# Patient Record
Sex: Female | Born: 1970 | Marital: Married | Smoking: Never smoker
Health system: Southern US, Community
[De-identification: ages and names within clinical notes are randomized; demographics above are authoritative.]

---
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
## 2006-10-26 DIAGNOSIS — R200 Anesthesia of skin: Secondary | ICD-10-CM | POA: Insufficient documentation

## 2006-10-26 DIAGNOSIS — G4700 Insomnia, unspecified: Secondary | ICD-10-CM | POA: Insufficient documentation

## 2010-01-10 HISTORY — PX: CHOLECYSTECTOMY: SHX55

## 2014-01-10 HISTORY — PX: TOTAL MASTECTOMY: SHX6129

## 2014-12-04 DIAGNOSIS — D0511 Intraductal carcinoma in situ of right breast: Secondary | ICD-10-CM | POA: Insufficient documentation

## 2014-12-22 DIAGNOSIS — Z853 Personal history of malignant neoplasm of breast: Secondary | ICD-10-CM | POA: Insufficient documentation

## 2015-01-11 HISTORY — PX: NEPHRECTOMY: SHX65

## 2015-12-09 DIAGNOSIS — E28319 Asymptomatic premature menopause: Secondary | ICD-10-CM | POA: Insufficient documentation

## 2015-12-11 DIAGNOSIS — E039 Hypothyroidism, unspecified: Secondary | ICD-10-CM | POA: Insufficient documentation

## 2016-01-18 DIAGNOSIS — E039 Hypothyroidism, unspecified: Secondary | ICD-10-CM | POA: Diagnosis not present

## 2016-01-20 DIAGNOSIS — R194 Change in bowel habit: Secondary | ICD-10-CM | POA: Diagnosis not present

## 2016-01-20 DIAGNOSIS — E039 Hypothyroidism, unspecified: Secondary | ICD-10-CM | POA: Diagnosis not present

## 2016-01-20 DIAGNOSIS — H04123 Dry eye syndrome of bilateral lacrimal glands: Secondary | ICD-10-CM | POA: Diagnosis not present

## 2016-02-10 DIAGNOSIS — E28319 Asymptomatic premature menopause: Secondary | ICD-10-CM | POA: Diagnosis not present

## 2016-02-10 DIAGNOSIS — K439 Ventral hernia without obstruction or gangrene: Secondary | ICD-10-CM | POA: Insufficient documentation

## 2016-02-25 DIAGNOSIS — K439 Ventral hernia without obstruction or gangrene: Secondary | ICD-10-CM | POA: Diagnosis not present

## 2016-03-02 DIAGNOSIS — N952 Postmenopausal atrophic vaginitis: Secondary | ICD-10-CM | POA: Diagnosis not present

## 2016-03-02 DIAGNOSIS — K439 Ventral hernia without obstruction or gangrene: Secondary | ICD-10-CM | POA: Diagnosis not present

## 2016-03-31 ENCOUNTER — Other Ambulatory Visit: Payer: Self-pay | Admitting: Family Medicine

## 2016-03-31 ENCOUNTER — Ambulatory Visit
Admission: RE | Admit: 2016-03-31 | Discharge: 2016-03-31 | Disposition: A | Discharge status: Home / Self Care | Payer: 59 | Source: Ambulatory Visit | Attending: Family Medicine | Admitting: Family Medicine

## 2016-03-31 DIAGNOSIS — T148XXA Other injury of unspecified body region, initial encounter: Secondary | ICD-10-CM

## 2016-03-31 DIAGNOSIS — R0781 Pleurodynia: Secondary | ICD-10-CM | POA: Diagnosis not present

## 2016-03-31 DIAGNOSIS — R079 Chest pain, unspecified: Secondary | ICD-10-CM | POA: Diagnosis not present

## 2016-03-31 DIAGNOSIS — M25512 Pain in left shoulder: Secondary | ICD-10-CM | POA: Diagnosis not present

## 2016-03-31 DIAGNOSIS — M542 Cervicalgia: Secondary | ICD-10-CM | POA: Diagnosis not present

## 2016-05-26 DIAGNOSIS — Z0000 Encounter for general adult medical examination without abnormal findings: Secondary | ICD-10-CM | POA: Diagnosis not present

## 2016-05-31 DIAGNOSIS — Z0000 Encounter for general adult medical examination without abnormal findings: Secondary | ICD-10-CM | POA: Diagnosis not present

## 2016-05-31 DIAGNOSIS — Z23 Encounter for immunization: Secondary | ICD-10-CM | POA: Diagnosis not present

## 2016-09-14 DIAGNOSIS — Z23 Encounter for immunization: Secondary | ICD-10-CM | POA: Diagnosis not present

## 2016-09-22 DIAGNOSIS — Z23 Encounter for immunization: Secondary | ICD-10-CM | POA: Diagnosis not present

## 2016-09-22 DIAGNOSIS — H1031 Unspecified acute conjunctivitis, right eye: Secondary | ICD-10-CM | POA: Diagnosis not present

## 2016-11-07 DIAGNOSIS — E039 Hypothyroidism, unspecified: Secondary | ICD-10-CM | POA: Diagnosis not present

## 2016-11-07 DIAGNOSIS — K130 Diseases of lips: Secondary | ICD-10-CM | POA: Diagnosis not present

## 2016-11-08 DIAGNOSIS — E039 Hypothyroidism, unspecified: Secondary | ICD-10-CM | POA: Diagnosis not present

## 2016-11-15 DIAGNOSIS — Z170 Estrogen receptor positive status [ER+]: Secondary | ICD-10-CM | POA: Diagnosis not present

## 2016-11-15 DIAGNOSIS — C50812 Malignant neoplasm of overlapping sites of left female breast: Secondary | ICD-10-CM | POA: Diagnosis not present

## 2016-11-15 DIAGNOSIS — D0511 Intraductal carcinoma in situ of right breast: Secondary | ICD-10-CM | POA: Diagnosis not present

## 2016-11-23 DIAGNOSIS — Z853 Personal history of malignant neoplasm of breast: Secondary | ICD-10-CM | POA: Diagnosis not present

## 2016-11-23 DIAGNOSIS — H1031 Unspecified acute conjunctivitis, right eye: Secondary | ICD-10-CM | POA: Diagnosis not present

## 2016-12-26 DIAGNOSIS — H04123 Dry eye syndrome of bilateral lacrimal glands: Secondary | ICD-10-CM | POA: Diagnosis not present

## 2016-12-26 DIAGNOSIS — E039 Hypothyroidism, unspecified: Secondary | ICD-10-CM | POA: Diagnosis not present

## 2017-01-06 DIAGNOSIS — E079 Disorder of thyroid, unspecified: Secondary | ICD-10-CM | POA: Diagnosis not present

## 2017-01-23 DIAGNOSIS — H10413 Chronic giant papillary conjunctivitis, bilateral: Secondary | ICD-10-CM | POA: Diagnosis not present

## 2017-01-23 DIAGNOSIS — H01111 Allergic dermatitis of right upper eyelid: Secondary | ICD-10-CM | POA: Diagnosis not present

## 2017-01-23 DIAGNOSIS — H01114 Allergic dermatitis of left upper eyelid: Secondary | ICD-10-CM | POA: Diagnosis not present

## 2017-02-06 DIAGNOSIS — J301 Allergic rhinitis due to pollen: Secondary | ICD-10-CM | POA: Diagnosis not present

## 2017-02-06 DIAGNOSIS — J3089 Other allergic rhinitis: Secondary | ICD-10-CM | POA: Diagnosis not present

## 2017-02-06 DIAGNOSIS — R05 Cough: Secondary | ICD-10-CM | POA: Diagnosis not present

## 2017-02-17 DIAGNOSIS — R5383 Other fatigue: Secondary | ICD-10-CM | POA: Diagnosis not present

## 2017-02-20 DIAGNOSIS — L309 Dermatitis, unspecified: Secondary | ICD-10-CM | POA: Diagnosis not present

## 2017-02-20 DIAGNOSIS — E039 Hypothyroidism, unspecified: Secondary | ICD-10-CM | POA: Diagnosis not present

## 2017-03-01 ENCOUNTER — Encounter (HOSPITAL_COMMUNITY): Payer: Self-pay | Admitting: Psychiatry

## 2017-03-01 ENCOUNTER — Ambulatory Visit (INDEPENDENT_AMBULATORY_CARE_PROVIDER_SITE_OTHER): Payer: 59 | Admitting: Psychiatry

## 2017-03-01 DIAGNOSIS — Z79899 Other long term (current) drug therapy: Secondary | ICD-10-CM

## 2017-03-01 DIAGNOSIS — R4581 Low self-esteem: Secondary | ICD-10-CM

## 2017-03-01 DIAGNOSIS — Z818 Family history of other mental and behavioral disorders: Secondary | ICD-10-CM | POA: Diagnosis not present

## 2017-03-01 DIAGNOSIS — F411 Generalized anxiety disorder: Secondary | ICD-10-CM

## 2017-03-01 DIAGNOSIS — F3341 Major depressive disorder, recurrent, in partial remission: Secondary | ICD-10-CM

## 2017-03-01 MED ORDER — SERTRALINE HCL 100 MG PO TABS: 100.0000 mg | ORAL_TABLET | Freq: Every day | ORAL | 1 refills | Status: DC

## 2017-03-01 MED ORDER — LORAZEPAM 1 MG PO TABS: 1.0000 mg | ORAL_TABLET | Freq: Every day | ORAL | 0 refills | Status: DC | PRN

## 2017-03-01 NOTE — Progress Notes (Signed)
Psychiatric Initial Adult Assessment   Patient Identification: Tonya Rojas MRN:  [id number] Date of Evaluation:  03/01/2017 Referral Source: self Chief Complaint:  depression Visit Diagnosis:    ICD-10-CM   1. Recurrent major depressive disorder, in partial remission (HCC) F33.41 sertraline (ZOLOFT) 100 MG tablet    Ambulatory referral to Psychology  2. GAD (generalized anxiety disorder) F41.1     History of Present Illness:  Tonya Rojas is a 47 year old female with a psychiatric history of depression and generalized anxiety, family history of depression.  She presents today for psychiatric intake assessment.  She recently transitioned here from [place] approximately 1 year ago with her husband and 3 children.  She has been contending with this transition in addition to issues related to her physical health including thyroid disease and recent double mastectomy and overectomy due to estrogen receptor positive cancer.  This has caused an increase in anxiety and mood symptoms as she had to contend with issues of mortality, although she is happy to share she is physically healthy now.  Spent time with her learning about her upbringing, and a particularly high achieving and goal oriented household, feeling that she had to be perfect in a way.  She developed obsessive tendencies and tendencies of anxiety and rumination that she grew older in order to cope with multiple stressful transitions.  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  She was medicated on Effexor for many years and continues on 75 mg XR daily.  Over the past year, she has noticed her depression and anxiety have been much more difficult to manage, she has difficulty with poor self-esteem, difficulty with sleeping due to rumination, difficulty with anhedonia and low motivation to participate in activities.  She identifies as a generally introverted individual.  She has a brother and sister that  are both on psychiatric medications, and she was able to find out today that they both take Zoloft and have had a good response.  I spent time with her educating her on the risks and benefits of SSRI, Zoloft, and agreed to discontinue Effexor and low-dose Lexapro 5 mg.  Her primary care doctor had initiated her on Lexapro in conjunction with Effexor.  She does not have any acute safety issues or substance use.  She is receptive to a referral for individual therapy.  Associated Signs/Symptoms: Depression Symptoms:  depressed mood, anhedonia, insomnia, psychomotor retardation, fatigue, feelings of worthlessness/guilt, impaired memory, anxiety, (Hypo) Manic Symptoms:  Irritable Mood, Anxiety Symptoms:  Excessive Worry, Psychotic Symptoms:  none PTSD Symptoms: Negative  Past Psychiatric History: No inpatient psychiatric hospitalization, outpatient therapy and medication management  Previous Psychotropic Medications: Yes   Substance Abuse History in the last 12 months:  No.  Consequences of Substance Abuse: Negative  Past Medical History: History reviewed. No pertinent past medical history. History reviewed. No pertinent surgical history.  Family Psychiatric History: Reviewed, family history of depression and anxiety  Family History: History reviewed. No pertinent family history.  Social History:   Social History   Socioeconomic History  . Marital status: Married    Spouse name: None  . Number of children: None  . Years of education: None  . Highest education level: None  Social Needs  . Financial resource strain: None  . Food insecurity - worry: None  . Food insecurity - inability: None  . Transportation needs - medical: None  . Transportation needs - non-medical: None  Occupational History  . None  Tobacco Use  . Smoking status:  None  Substance and Sexual Activity  . Alcohol use: None  . Drug use: None  . Sexual activity: None  Other Topics Concern  . None   Social History Narrative  . 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 albuterol (PROVENTIL HFA;VENTOLIN HFA) 108 (90 Base) MCG/ACT inhaler Inhale into the lungs.    Marland Kitchen aspirin 81 MG chewable tablet Chew by mouth.    Marland Kitchen azelastine (OPTIVAR) 0.05 % ophthalmic solution   0  . levothyroxine (SYNTHROID, LEVOTHROID) 112 MCG tablet take 1 tablet by mouth every other day IN THE MORNING ON AN EMPTY STOMACH  0  . levothyroxine (SYNTHROID, LEVOTHROID) 125 MCG tablet take 1 tablet by mouth every other day ON AN EMPTY STOMACH  0  . LORazepam (ATIVAN) 1 MG tablet Take 1 tablet (1 mg total) by mouth daily as needed for anxiety. 30 tablet 0  . sertraline (ZOLOFT) 100 MG tablet Take 1 tablet (100 mg total) by mouth daily. Take 1/2 tablet for 1 week then increase to 1 tablet daily 90 tablet 1   No current facility-administered medications for this visit.     Neurologic: Headache: Negative Seizure: Negative Paresthesias:Negative  Musculoskeletal: Strength & Muscle Tone: within normal limits Gait & Station: normal Patient leans: N/A  Psychiatric Specialty Exam: Review of Systems  Constitutional: Negative.   HENT: Negative.   Respiratory: Negative.   Cardiovascular: Negative.   Gastrointestinal: Negative.   Genitourinary: Negative.   Musculoskeletal: Negative.   Neurological: Negative.   Psychiatric/Behavioral: Positive for depression and memory loss. The patient is nervous/anxious.     There were no vitals taken for this visit.There is no height or weight on file to calculate BMI.  General Appearance: Casual and Well Groomed  Eye Contact:  Good   Speech:  Clear and Coherent and Normal Rate  Volume:  Normal  Mood:  Dysphoric  Affect:  Congruent  Thought Process:  Goal Directed and Descriptions of Associations: Intact  Orientation:  Full (Time, Place, and Person)  Thought Content:  Logical  Suicidal Thoughts:  No  Homicidal Thoughts:  No  Memory:  Immediate;   Good  Judgement:  Good  Insight:  Good  Psychomotor Activity:  Normal  Concentration:  Concentration: Good  Recall:  Good  Fund of Knowledge:Good  Language: Good  Akathisia:  Negative  Handed:  Right  AIMS (if indicated):  na  Assets:  Communication Skills Desire for Improvement Financial Resources/Insurance Housing Intimacy Leisure Time Physical Health Resilience Social Support Talents/Skills Transportation Vocational/Educational  ADL's:  Intact  Cognition: WNL  Sleep:  6 hours, poor    Treatment Plan Summary: Tonya Rojas is a 47 year old female with a history of breast cancer, status post double mastectomy and oophorectomy.  She has a psychiatric history of major depressive disorder with anxious features.  She presents today with only partial remission of her symptoms on Effexor and Lexapro.  She has tried increased doses of Effexor without adequate response.  She has a positive family history of response to Zoloft for depression, and presents today with some obsessive tendencies.  I suspect she would benefit from a more serotonergic agent such as Zoloft, and  have instructed her to proceed as below.  No acute safety issues or suicidality or substance use.  She may use Ativan on an as-needed basis 2-3 times per week for acute anxiety.  1. Recurrent major depressive disorder, in partial remission ([place])   2. GAD (generalized anxiety disorder)     Status of current problems: new  Labs Ordered: Orders Placed This Encounter  Procedures  . Ambulatory referral to Psychology    Referral Priority:   Routine    Referral Type:   Psychiatric    Referral  Reason:   Specialty Services Required    Requested Specialty:   Psychology    Number of Visits Requested:   1    Labs Reviewed: n/a  Collateral Obtained/Records Reviewed: n/a  Plan:  Discontinue Effexor 75 mg xr Discontinue Lexapro 5 mg Zoloft 50 mg x 1 week then increase to 100 mg Okay to increase Zoloft in 3-4 weeks, to 150 mg  I spent 45 minutes with the patient in direct face-to-face clinical care.  Greater than 50% of this time was spent in counseling and coordination of care with the patient.    Aundra Dubin, MD 2/20/201910:39 AM

## 2017-03-29 ENCOUNTER — Ambulatory Visit: Payer: 59 | Admitting: Clinical

## 2017-03-30 ENCOUNTER — Encounter (HOSPITAL_COMMUNITY): Payer: Self-pay | Admitting: Psychiatry

## 2017-03-30 DIAGNOSIS — F3341 Major depressive disorder, recurrent, in partial remission: Secondary | ICD-10-CM

## 2017-03-31 MED ORDER — SERTRALINE HCL 100 MG PO TABS: 200.0000 mg | ORAL_TABLET | Freq: Every day | ORAL | 0 refills | Status: DC

## 2017-04-04 ENCOUNTER — Telehealth (HOSPITAL_COMMUNITY): Payer: Self-pay

## 2017-04-04 NOTE — Telephone Encounter (Signed)
Pharmacy sent in a refill request for Lorazepam 1mg . Next appointment is scheduled for 05/01/17. Please advise

## 2017-04-05 ENCOUNTER — Other Ambulatory Visit (HOSPITAL_COMMUNITY): Payer: Self-pay | Admitting: Psychiatry

## 2017-04-05 MED ORDER — LORAZEPAM 1 MG PO TABS: 1.0000 mg | ORAL_TABLET | Freq: Every day | ORAL | 0 refills | Status: DC | PRN

## 2017-04-05 NOTE — Telephone Encounter (Signed)
Thanks

## 2017-04-05 NOTE — Telephone Encounter (Signed)
All done, I sent over via e prescribing

## 2017-04-10 ENCOUNTER — Ambulatory Visit (INDEPENDENT_AMBULATORY_CARE_PROVIDER_SITE_OTHER): Payer: 59 | Admitting: Clinical

## 2017-04-10 DIAGNOSIS — F4323 Adjustment disorder with mixed anxiety and depressed mood: Secondary | ICD-10-CM | POA: Diagnosis not present

## 2017-05-01 ENCOUNTER — Ambulatory Visit (INDEPENDENT_AMBULATORY_CARE_PROVIDER_SITE_OTHER): Payer: 59 | Admitting: Psychiatry

## 2017-05-01 ENCOUNTER — Encounter (HOSPITAL_COMMUNITY): Payer: Self-pay | Admitting: Psychiatry

## 2017-05-01 VITALS — BP 104/66 | HR 65 | Ht 64.5 in | Wt 158.0 lb

## 2017-05-01 DIAGNOSIS — F411 Generalized anxiety disorder: Secondary | ICD-10-CM

## 2017-05-01 DIAGNOSIS — F3341 Major depressive disorder, recurrent, in partial remission: Secondary | ICD-10-CM | POA: Diagnosis not present

## 2017-05-01 DIAGNOSIS — Z818 Family history of other mental and behavioral disorders: Secondary | ICD-10-CM | POA: Diagnosis not present

## 2017-05-01 MED ORDER — LAMOTRIGINE 25 MG PO TABS: ORAL_TABLET | ORAL | 1 refills | Status: DC

## 2017-05-01 MED ORDER — SERTRALINE HCL 100 MG PO TABS: 200.0000 mg | ORAL_TABLET | Freq: Every day | ORAL | 0 refills | Status: DC

## 2017-05-01 NOTE — Progress Notes (Signed)
[place] MD/PA/NP OP Progress Note  05/01/2017 2:35 PM Tonya Rojas  MRN:  [id number]  Chief Complaint: Doing much better HPI: Patient presents for follow-up medication management.  Reports that the Zoloft has been very effective at reducing her anxiety and she rarely uses the Ativan if at all.  She reports that she has noticed she is able to better compartmentalize.  She does struggle with ongoing patterns of rumination and avoidance, and features of anxious depression.  She denies any acute safety issues.  She struggles with low energy and fatigue, and feels generally burned out by the end of the day.  She is working on activating strategies such as exercise and her and her husband recently adopted a puppy which has been positive for them.  We discussed augmentation strategies including the use of Wellbutrin, or switch to Effexor, and we also discussed augmentation with Lamictal, given her anxious features of depression.  We agreed to proceed with Lamictal and reviewed the risks of SJS and aseptic meningitis.  We will titrate as below and follow-up in 6-8 weeks.  Visit Diagnosis:    ICD-10-CM   1. GAD (generalized anxiety disorder) F41.1   2. Recurrent major depressive disorder, in partial remission (HCC) F33.41 sertraline (ZOLOFT) 100 MG tablet    Past Psychiatric History: See intake H&P for full details. Reviewed, with no updates at this time.   Past Medical History: No past medical history on file.  Past Surgical History:  Procedure Laterality Date  . CHOLECYSTECTOMY  2012  . HERNIA REPAIR Bilateral 1982  . NEPHRECTOMY Bilateral 2017  . TOTAL MASTECTOMY Bilateral 2016    Family Psychiatric History: See intake H&P for full details. Reviewed, with no updates at this time.   Family History:  Family History  Problem Relation Age of Onset  . Depression Sister   . Anxiety disorder Brother   . Bipolar disorder Brother     Social History:  Social History   Socioeconomic History   . Marital status: Married    Spouse name: Not on file  . Number of children: Not on file  . Years of education: Not on file  . Highest education level: Not on file  Occupational History  . Not on file  Social Needs  . Financial resource strain: Not on file  . Food insecurity:    Worry: Not on file    Inability: Not on file  . Transportation needs:    Medical: Not on file    Non-medical: Not on file  Tobacco Use  . Smoking status: Never Smoker  . Smokeless tobacco: Never Used  Substance and Sexual Activity  . Alcohol use: Not on file  . Drug use: Not Currently  . Sexual activity: Yes    Partners: Male    Birth control/protection: None, Surgical  Lifestyle  . Physical activity:    Days per week: Not on file    Minutes per session: Not on file  . Stress: Not on file  Relationships  . Social connections:    Talks on phone: Not on file    Gets together: Not on file    Attends religious service: Not on file    Active member of club or organization: Not on file    Attends meetings of clubs or organizations: Not on file    Relationship status: Not on file  Other Topics Concern  . Not on file  Social History Narrative  . Not on file    Allergies:  Allergies  Allergen  Reactions  . Paroxetine Swelling  . Penicillins Hives    Metabolic Disorder Labs: No results found for: HGBA1C, MPG No results found for: PROLACTIN No results found for: CHOL, TRIG, HDL, CHOLHDL, VLDL, LDLCALC No results found for: TSH  Therapeutic Level Labs: No results found for: LITHIUM No results found for: VALPROATE No components found for:  CBMZ  Current Medications: Current Outpatient Medications  Medication Sig Dispense Refill  . albuterol (PROVENTIL HFA;VENTOLIN HFA) 108 (90 Base) MCG/ACT inhaler Inhale into the lungs.    Marland Kitchen azelastine (OPTIVAR) 0.05 % ophthalmic solution   0  . levocetirizine (XYZAL) 5 MG tablet Take 5 mg by mouth daily.  3  . levothyroxine (SYNTHROID, LEVOTHROID) 112  MCG tablet take 1 tablet by mouth every other day IN THE MORNING ON AN EMPTY STOMACH  0  . levothyroxine (SYNTHROID, LEVOTHROID) 125 MCG tablet take 1 tablet by mouth every other day ON AN EMPTY STOMACH  0  . LORazepam (ATIVAN) 1 MG tablet Take 1 tablet (1 mg total) by mouth daily as needed for anxiety. 30 tablet 0  . Multiple Vitamins-Minerals (MULTIVITAMIN ADULT PO) Take by mouth.    . sertraline (ZOLOFT) 100 MG tablet Take 2 tablets (200 mg total) by mouth daily. 180 tablet 0  . aspirin 81 MG chewable tablet Chew by mouth.    . lamoTRIgine (LAMICTAL) 25 MG tablet Start at 1 tablet daily, then increase to 2 tablets daily 180 tablet 1   No current facility-administered medications for this visit.     Musculoskeletal: Strength & Muscle Tone: within normal limits Gait & Station: normal Patient leans: N/A  Psychiatric Specialty Exam: ROS  Blood pressure 104/66, pulse 65, height 5' 4.5" (1.638 m), weight 158 lb (71.7 kg), SpO2 96 %.Body mass index is 26.7 kg/m.  General Appearance: Casual and Well Groomed  Eye Contact:  Good  Speech:  Clear and Coherent and Normal Rate  Volume:  Normal  Mood:  Dysphoric  Affect:  Congruent  Thought Process:  Goal Directed and Descriptions of Associations: Intact  Orientation:  Full (Time, Place, and Person)  Thought Content: Logical   Suicidal Thoughts:  No  Homicidal Thoughts:  No  Memory:  Immediate;   Good  Judgement:  Good  Insight:  Good  Psychomotor Activity:  Normal  Concentration:  Concentration: Fair  Recall:  Good  Fund of Knowledge: Good  Language: Good  Akathisia:  Negative  Handed:  Right  AIMS (if indicated): not done  Assets:  Communication Skills Desire for Improvement Financial Resources/Insurance Housing Intimacy Leisure Time Physical Health Resilience Social Support Talents/Skills Transportation Vocational/Educational  ADL's:  Intact  Cognition: WNL  Sleep:  Good   Screenings:   Assessment and Plan:   Tonya Rojas presents with partial remission of depression symptoms, and seems to be doing better on the high dose of Zoloft.  She does still continue with anxious depression features, patterns of rumination, and anhedonia.  We discussed augmentation with lamotrigine, given that she has a family history of bipolar disorder in her brother, and perhaps she may benefit from this modality of augmentation of SSRI.  Reviewed the risk of SJS and aseptic meningitis and agreed to proceed as below, we will follow-up in 6-8 weeks.  Disclosed to patient that this Probation officer is leaving this practice at the end of August 2019, and patients always has the right to choose their provider. Reassured patient that office will work to provide smooth transition of care whether they wish to remain at  this office, or to continue with this provider, or seek alternative care options in community.  They expressed understanding.   1. GAD (generalized anxiety disorder)   2. Recurrent major depressive disorder, in partial remission ([place])     Status of current problems: gradually improving  Labs Ordered: No orders of the defined types were placed in this encounter.   Labs Reviewed: n/a  Collateral Obtained/Records Reviewed: n/a  Plan:  Continue Zoloft 200 mg daily Lamictal 25 mg x 1 week, then 50 mg daily rtc 6 weeks  I spent 20 minutes with the patient in direct face-to-face clinical care.  Greater than 50% of this time was spent in counseling and coordination of care with the patient.    Aundra Dubin, MD 05/01/2017, 2:35 PM

## 2017-05-08 ENCOUNTER — Ambulatory Visit (INDEPENDENT_AMBULATORY_CARE_PROVIDER_SITE_OTHER): Payer: 59 | Admitting: Clinical

## 2017-05-08 DIAGNOSIS — F4323 Adjustment disorder with mixed anxiety and depressed mood: Secondary | ICD-10-CM

## 2017-05-15 ENCOUNTER — Ambulatory Visit (INDEPENDENT_AMBULATORY_CARE_PROVIDER_SITE_OTHER): Payer: 59 | Admitting: Clinical

## 2017-05-15 DIAGNOSIS — F4323 Adjustment disorder with mixed anxiety and depressed mood: Secondary | ICD-10-CM | POA: Diagnosis not present

## 2017-05-24 IMAGING — CR DG CHEST 2V
2 series · 2 of 2 positions shown · non-contrast
Comparison: None.

CLINICAL DATA: Chest injury on the right 4 days ago. Right upper
anterior chest pain.

EXAM:
CHEST  2 VIEW

[w chest pa]
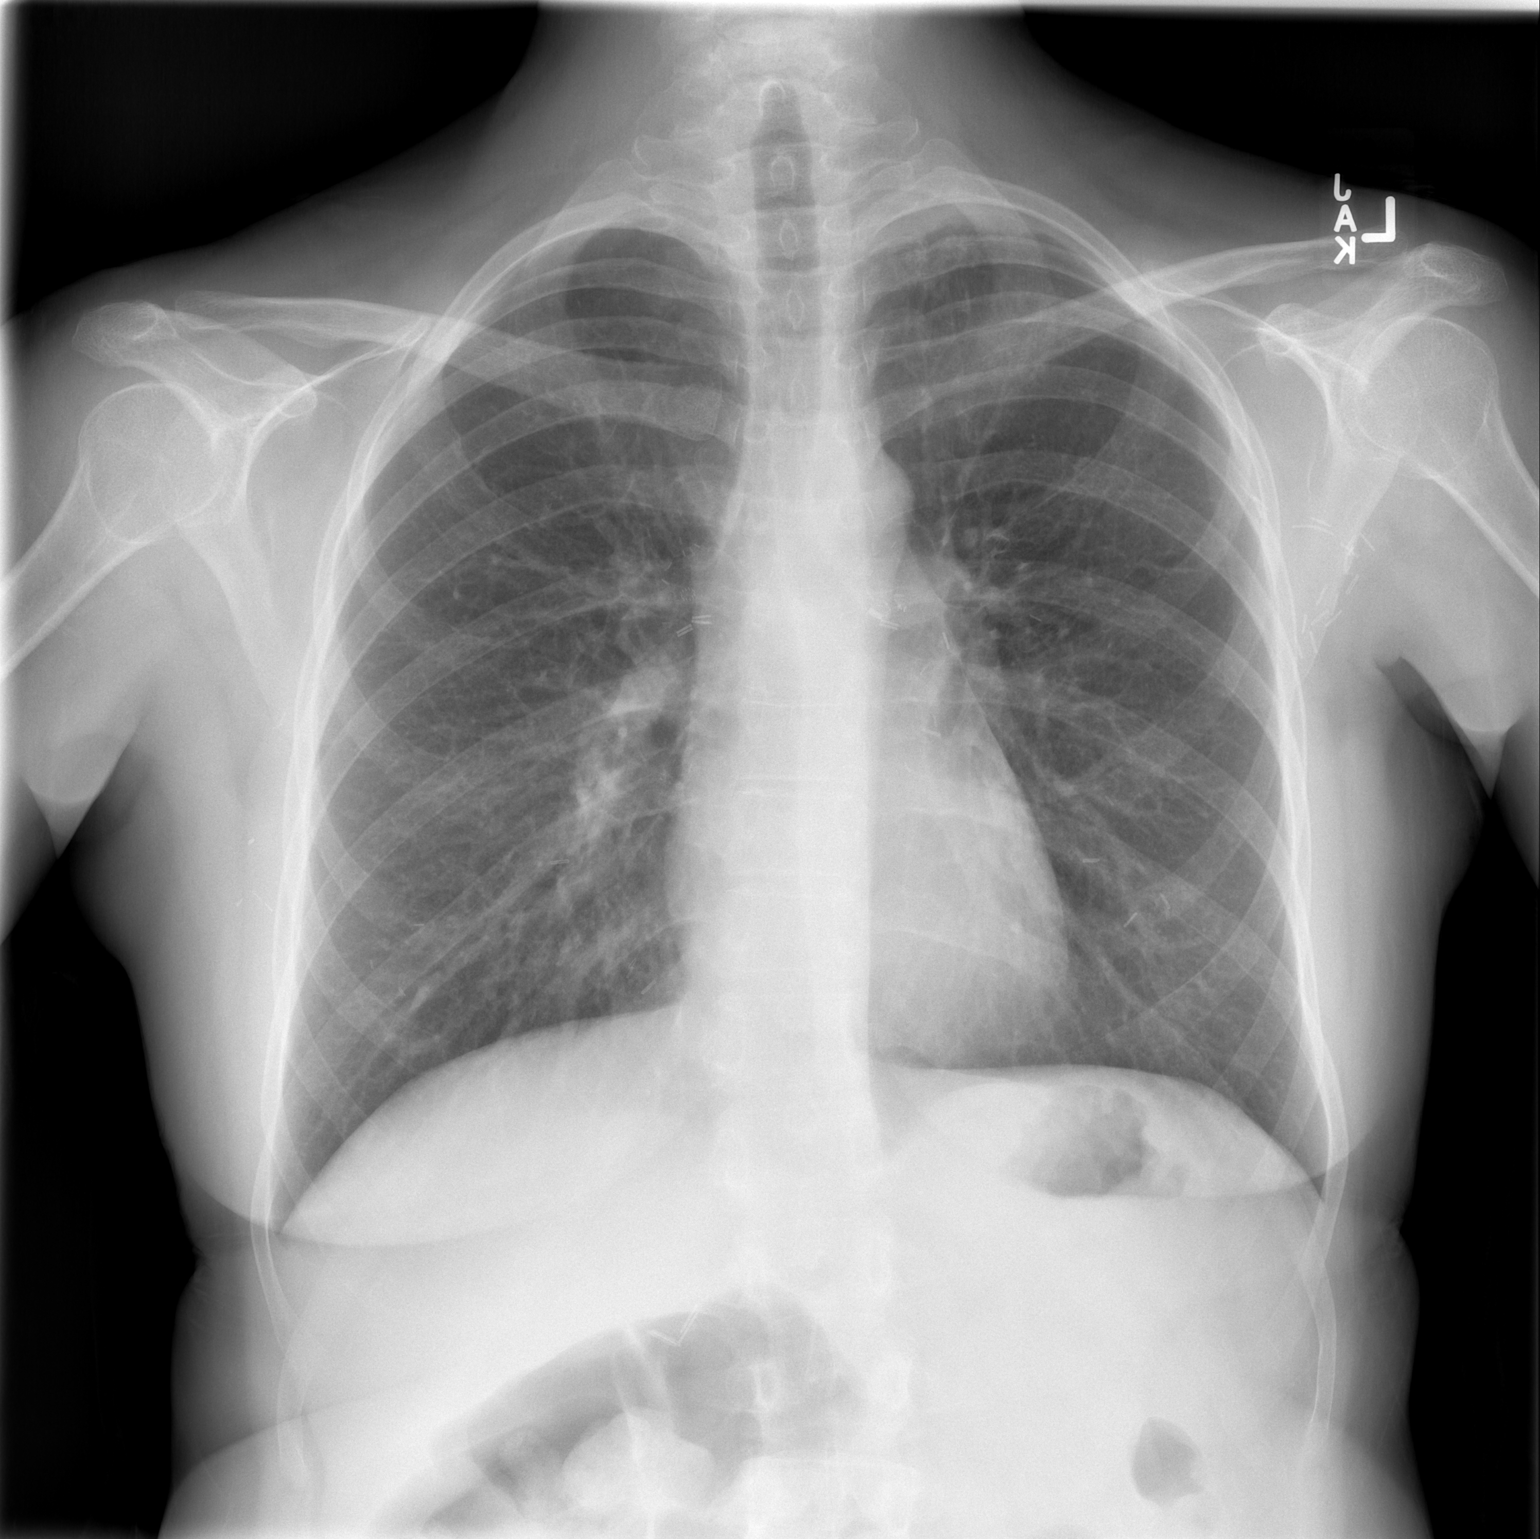

[w chest lat]
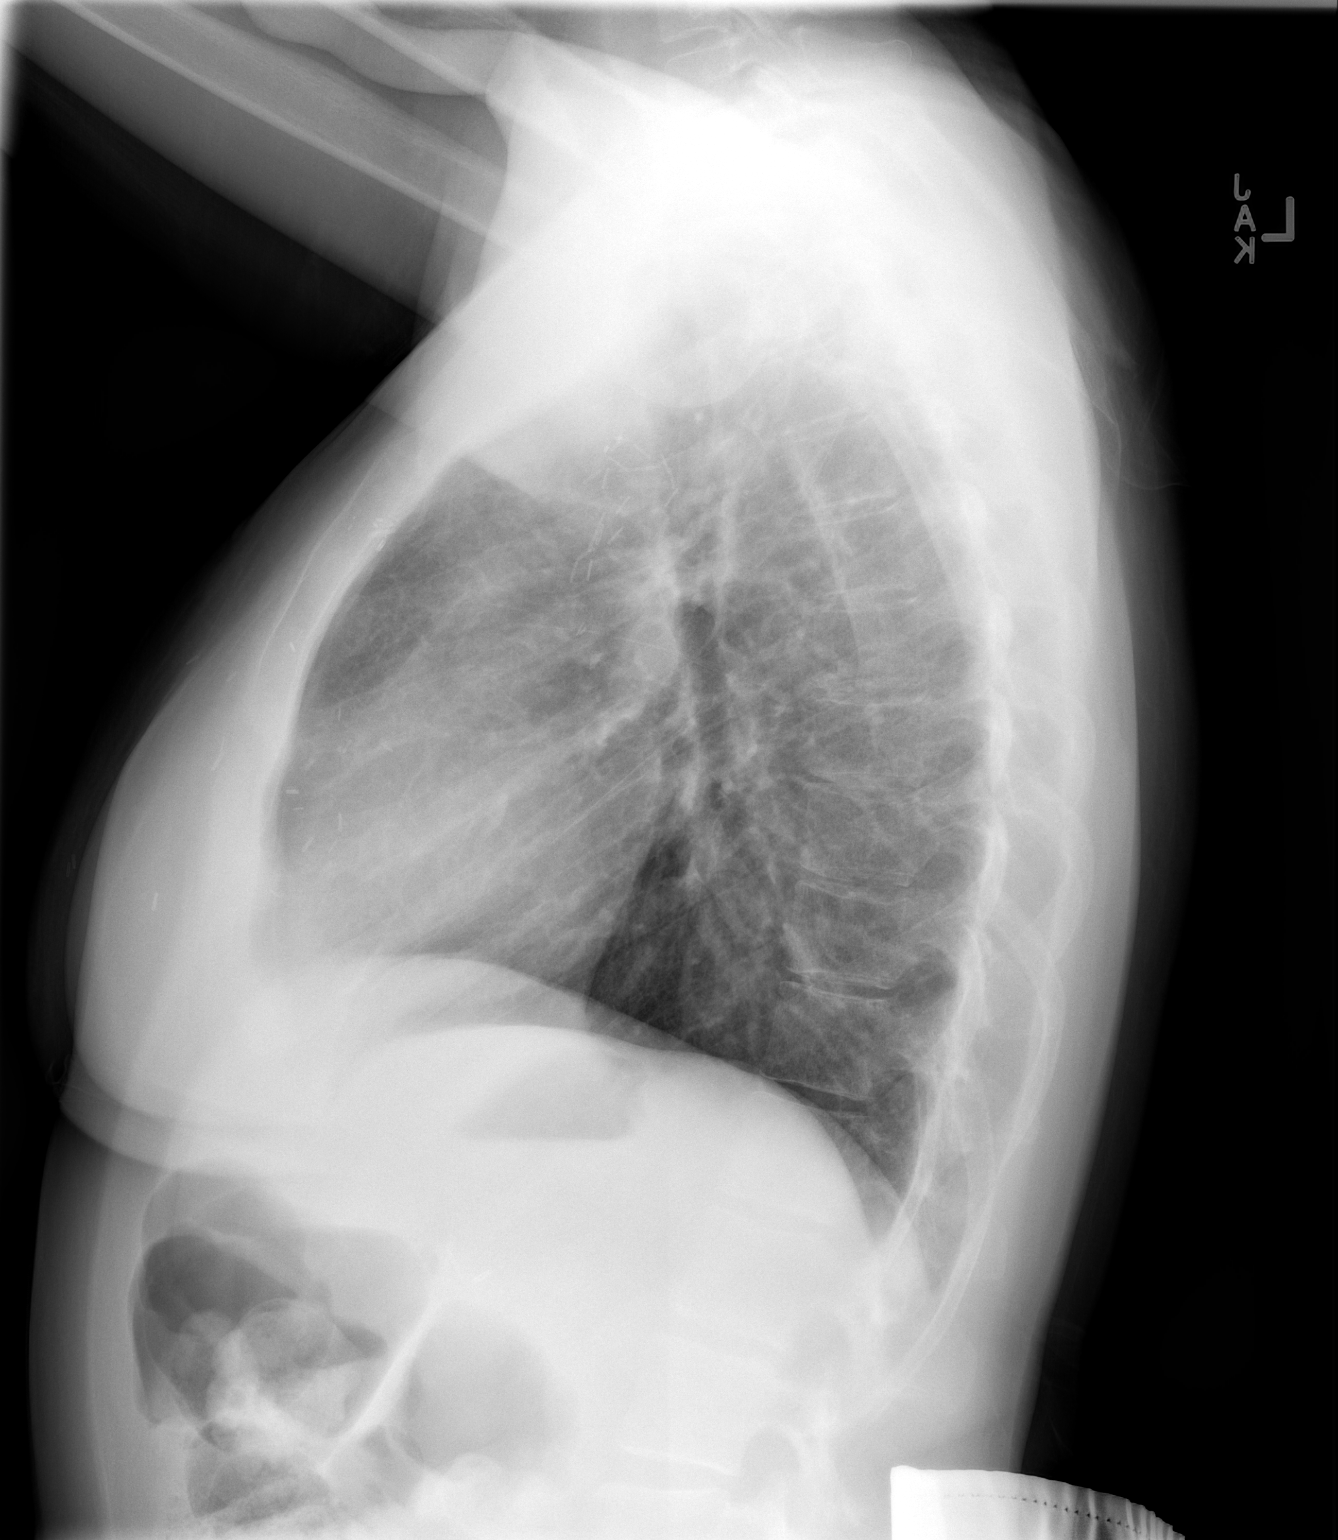

[2 of 2 positions shown; findings below may reference images not displayed]

FINDINGS: Heart size is normal. Mediastinal shadows are normal. The lungs are
clear except for mild pleural and parenchymal scarring at the left
apex. No pneumothorax or hemothorax. No visible rib fracture.
Thoracic spine appears normal. Previous chest wall reconstructive
surgery and left axillary node dissection.
IMPRESSION: No acute or traumatic finding. No rib fracture seen. If concern
persists, rib detail images could be done. There is no pneumothorax
or hemothorax.

## 2017-05-29 ENCOUNTER — Ambulatory Visit (INDEPENDENT_AMBULATORY_CARE_PROVIDER_SITE_OTHER): Payer: 59 | Admitting: Clinical

## 2017-05-29 DIAGNOSIS — F4323 Adjustment disorder with mixed anxiety and depressed mood: Secondary | ICD-10-CM | POA: Diagnosis not present

## 2017-06-12 ENCOUNTER — Ambulatory Visit: Payer: 59 | Admitting: Clinical

## 2017-06-19 ENCOUNTER — Ambulatory Visit: Payer: 59 | Admitting: Clinical

## 2017-07-17 DIAGNOSIS — Z0000 Encounter for general adult medical examination without abnormal findings: Secondary | ICD-10-CM | POA: Diagnosis not present

## 2017-07-19 DIAGNOSIS — Z0000 Encounter for general adult medical examination without abnormal findings: Secondary | ICD-10-CM | POA: Diagnosis not present

## 2017-07-19 DIAGNOSIS — M79672 Pain in left foot: Secondary | ICD-10-CM | POA: Diagnosis not present

## 2017-07-19 DIAGNOSIS — Z6824 Body mass index (BMI) 24.0-24.9, adult: Secondary | ICD-10-CM | POA: Diagnosis not present

## 2017-07-27 ENCOUNTER — Encounter (HOSPITAL_COMMUNITY): Payer: Self-pay | Admitting: Psychiatry

## 2017-07-27 ENCOUNTER — Ambulatory Visit (INDEPENDENT_AMBULATORY_CARE_PROVIDER_SITE_OTHER): Payer: 59 | Admitting: Psychiatry

## 2017-07-27 DIAGNOSIS — F3341 Major depressive disorder, recurrent, in partial remission: Secondary | ICD-10-CM | POA: Diagnosis not present

## 2017-07-27 DIAGNOSIS — F411 Generalized anxiety disorder: Secondary | ICD-10-CM

## 2017-07-27 MED ORDER — SERTRALINE HCL 100 MG PO TABS: 200.0000 mg | ORAL_TABLET | Freq: Every day | ORAL | 0 refills | Status: AC

## 2017-07-27 MED ORDER — LAMOTRIGINE 100 MG PO TABS: 100.0000 mg | ORAL_TABLET | Freq: Every day | ORAL | 0 refills | Status: AC

## 2017-07-27 MED ORDER — LORAZEPAM 1 MG PO TABS: 1.0000 mg | ORAL_TABLET | Freq: Every day | ORAL | 0 refills | Status: AC | PRN

## 2017-07-27 NOTE — Progress Notes (Signed)
[name] MD/PA/NP OP Progress Note  07/27/2017 10:39 AM Tonya Rojas  MRN:  [id number]  Chief Complaint: Doing much better HPI: Tonya Rojas feels like a fog is lifted with the Lamictal and she feels better than she has in many years.  She has not had any side effects or GI intolerance, no skin rash.  Spent time discussing increased to 100 mg for a maintenance dose and to see if we can appreciate any further improvements in her mood and anxiety.  She was agreeable to this as she notes she continues to have some ritualistic skin picking behaviors when she is anxious.  We spent time discussing some behavioral strategies to troubleshoot this as well as pharmacologic.  She was agreeable to follow-up in 10-12 weeks or sooner if needed.  Reiterated the increased dose of Lamictal to 100 mg, and reiterated the risk of SJS and encouraged her to be cognizant of any deleterious effects.  No acute safety concerns and continues on Zoloft 200 mg at the routine dose.  Visit Diagnosis:    ICD-10-CM   1. Recurrent major depressive disorder, in partial remission (HCC) F33.41 lamoTRIgine (LAMICTAL) 100 MG tablet    sertraline (ZOLOFT) 100 MG tablet    LORazepam (ATIVAN) 1 MG tablet  2. GAD (generalized anxiety disorder) F41.1 lamoTRIgine (LAMICTAL) 100 MG tablet    LORazepam (ATIVAN) 1 MG tablet    Past Psychiatric History: See intake H&P for full details. Reviewed, with no updates at this time.   Past Medical History: No past medical history on file.  Past Surgical History:  Procedure Laterality Date  . CHOLECYSTECTOMY  2012  . HERNIA REPAIR Bilateral 1982  . NEPHRECTOMY Bilateral 2017  . TOTAL MASTECTOMY Bilateral 2016    Family Psychiatric History: See intake H&P for full details. Reviewed, with no updates at this time.   Family History:  Family History  Problem Relation Age of Onset  . Depression Sister   . Anxiety disorder Brother   . Bipolar disorder Brother     Social History:   Social History   Socioeconomic History  . Marital status: Married    Spouse name: Not on file  . Number of children: Not on file  . Years of education: Not on file  . Highest education level: Not on file  Occupational History  . Not on file  Social Needs  . Financial resource strain: Not on file  . Food insecurity:    Worry: Not on file    Inability: Not on file  . Transportation needs:    Medical: Not on file    Non-medical: Not on file  Tobacco Use  . Smoking status: Never Smoker  . Smokeless tobacco: Never Used  Substance and Sexual Activity  . Alcohol use: Not on file  . Drug use: Not Currently  . Sexual activity: Yes    Partners: Male    Birth control/protection: None, Surgical  Lifestyle  . Physical activity:    Days per week: Not on file    Minutes per session: Not on file  . Stress: Not on file  Relationships  . Social connections:    Talks on phone: Not on file    Gets together: Not on file    Attends religious service: Not on file    Active member of club or organization: Not on file    Attends meetings of clubs or organizations: Not on file    Relationship status: Not on file  Other Topics Concern  . Not  on file  Social History Narrative  . Not on file    Allergies:  Allergies  Allergen Reactions  . Paroxetine Swelling  . Penicillins Hives    Metabolic Disorder Labs: No results found for: HGBA1C, MPG No results found for: PROLACTIN No results found for: CHOL, TRIG, HDL, CHOLHDL, VLDL, LDLCALC No results found for: TSH  Therapeutic Level Labs: No results found for: LITHIUM No results found for: VALPROATE No components found for:  CBMZ  Current Medications: Current Outpatient Medications  Medication Sig Dispense Refill  . albuterol (PROVENTIL HFA;VENTOLIN HFA) 108 (90 Base) MCG/ACT inhaler Inhale into the lungs.    Marland Kitchen aspirin 81 MG chewable tablet Chew by mouth.    Marland Kitchen azelastine (OPTIVAR) 0.05 % ophthalmic solution   0  . lamoTRIgine  (LAMICTAL) 100 MG tablet Take 1 tablet (100 mg total) by mouth daily. Start at 1 tablet daily, then increase to 2 tablets daily 90 tablet 0  . levocetirizine (XYZAL) 5 MG tablet Take 5 mg by mouth daily.  3  . levothyroxine (SYNTHROID, LEVOTHROID) 112 MCG tablet take 1 tablet by mouth every other day IN THE MORNING ON AN EMPTY STOMACH  0  . levothyroxine (SYNTHROID, LEVOTHROID) 125 MCG tablet take 1 tablet by mouth every other day ON AN EMPTY STOMACH  0  . LORazepam (ATIVAN) 1 MG tablet Take 1 tablet (1 mg total) by mouth daily as needed for anxiety. 30 tablet 0  . Multiple Vitamins-Minerals (MULTIVITAMIN ADULT PO) Take by mouth.    . sertraline (ZOLOFT) 100 MG tablet Take 2 tablets (200 mg total) by mouth daily. 180 tablet 0   No current facility-administered medications for this visit.     Musculoskeletal: Strength & Muscle Tone: within normal limits Gait & Station: normal Patient leans: N/A  Psychiatric Specialty Exam: ROS  There were no vitals taken for this visit.There is no height or weight on file to calculate BMI.  General Appearance: Casual and Well Groomed  Eye Contact:  Good  Speech:  Clear and Coherent and Normal Rate  Volume:  Normal  Mood:  Euthymic and much better  Affect:  Congruent  Thought Process:  Goal Directed and Descriptions of Associations: Intact  Orientation:  Full (Time, Place, and Person)  Thought Content: Logical   Suicidal Thoughts:  No  Homicidal Thoughts:  No  Memory:  Immediate;   Good  Judgement:  Good  Insight:  Good  Psychomotor Activity:  Normal  Concentration:  Concentration: Fair  Recall:  Good  Fund of Knowledge: Good  Language: Good  Akathisia:  Negative  Handed:  Right  AIMS (if indicated): not done  Assets:  Communication Skills Desire for Improvement Financial Resources/Insurance Housing Intimacy Leisure Time Physical Health Resilience Social Support Talents/Skills Transportation Vocational/Educational  ADL's:  Intact   Cognition: WNL  Sleep:  Good   Screenings:   Assessment and Plan:  [name] presents with ongoing improvements in her mood and anxiety symptoms.  She has had an appreciable improvement in her depression with Lamictal and may benefit further from an increased dose as below.  We will follow-up in 10-12 weeks or sooner if needed.  She has no suicidality or substance abuse, or other acute concerns at this time.  1. Recurrent major depressive disorder, in partial remission ([place])   2. GAD (generalized anxiety disorder)     Status of current problems: gradually improving  Labs Ordered: No orders of the defined types were placed in this encounter.   Labs Reviewed: n/a  Collateral Obtained/Records Reviewed: n/a  Plan:  Continue Zoloft 200 mg daily Lamictal 100 mg daily Ativan for acute anxiety/panic/sleep prn rtc 10-12 weeks   Aundra Dubin, MD 07/27/2017, 10:39 AM

## 2017-08-24 DIAGNOSIS — J069 Acute upper respiratory infection, unspecified: Secondary | ICD-10-CM | POA: Diagnosis not present

## 2017-09-08 DIAGNOSIS — E039 Hypothyroidism, unspecified: Secondary | ICD-10-CM | POA: Diagnosis not present

## 2017-09-14 DIAGNOSIS — R05 Cough: Secondary | ICD-10-CM | POA: Diagnosis not present

## 2017-09-14 DIAGNOSIS — J3089 Other allergic rhinitis: Secondary | ICD-10-CM | POA: Diagnosis not present

## 2017-09-14 DIAGNOSIS — J301 Allergic rhinitis due to pollen: Secondary | ICD-10-CM | POA: Diagnosis not present

## 2017-09-15 DIAGNOSIS — J309 Allergic rhinitis, unspecified: Secondary | ICD-10-CM | POA: Diagnosis not present

## 2017-09-15 DIAGNOSIS — E039 Hypothyroidism, unspecified: Secondary | ICD-10-CM | POA: Diagnosis not present

## 2017-09-15 DIAGNOSIS — Z23 Encounter for immunization: Secondary | ICD-10-CM | POA: Diagnosis not present

## 2017-09-15 DIAGNOSIS — Z6826 Body mass index (BMI) 26.0-26.9, adult: Secondary | ICD-10-CM | POA: Diagnosis not present

## 2017-10-27 DIAGNOSIS — E039 Hypothyroidism, unspecified: Secondary | ICD-10-CM | POA: Diagnosis not present

## 2017-10-27 DIAGNOSIS — E782 Mixed hyperlipidemia: Secondary | ICD-10-CM | POA: Diagnosis not present

## 2017-10-27 DIAGNOSIS — R945 Abnormal results of liver function studies: Secondary | ICD-10-CM | POA: Diagnosis not present

## 2018-03-01 DIAGNOSIS — J209 Acute bronchitis, unspecified: Secondary | ICD-10-CM | POA: Diagnosis not present

## 2018-03-01 DIAGNOSIS — J4530 Mild persistent asthma, uncomplicated: Secondary | ICD-10-CM | POA: Diagnosis not present

## 2018-03-01 DIAGNOSIS — J301 Allergic rhinitis due to pollen: Secondary | ICD-10-CM | POA: Diagnosis not present

## 2018-03-01 DIAGNOSIS — J3089 Other allergic rhinitis: Secondary | ICD-10-CM | POA: Diagnosis not present

## 2018-03-14 ENCOUNTER — Ambulatory Visit
Admission: RE | Admit: 2018-03-14 | Discharge: 2018-03-14 | Disposition: A | Discharge status: Home / Self Care | Payer: BLUE CROSS/BLUE SHIELD | Source: Ambulatory Visit | Attending: Allergy and Immunology | Admitting: Allergy and Immunology

## 2018-03-14 ENCOUNTER — Other Ambulatory Visit: Payer: Self-pay | Admitting: Allergy and Immunology

## 2018-03-14 DIAGNOSIS — J301 Allergic rhinitis due to pollen: Secondary | ICD-10-CM | POA: Diagnosis not present

## 2018-03-14 DIAGNOSIS — R05 Cough: Secondary | ICD-10-CM | POA: Diagnosis not present

## 2018-03-14 DIAGNOSIS — J4530 Mild persistent asthma, uncomplicated: Secondary | ICD-10-CM

## 2018-03-14 DIAGNOSIS — J3089 Other allergic rhinitis: Secondary | ICD-10-CM | POA: Diagnosis not present

## 2018-03-14 DIAGNOSIS — L209 Atopic dermatitis, unspecified: Secondary | ICD-10-CM | POA: Diagnosis not present

## 2018-03-14 DIAGNOSIS — J40 Bronchitis, not specified as acute or chronic: Secondary | ICD-10-CM | POA: Diagnosis not present

## 2018-03-19 DIAGNOSIS — J209 Acute bronchitis, unspecified: Secondary | ICD-10-CM | POA: Diagnosis not present

## 2018-03-19 DIAGNOSIS — J069 Acute upper respiratory infection, unspecified: Secondary | ICD-10-CM | POA: Diagnosis not present

## 2018-04-25 DIAGNOSIS — F411 Generalized anxiety disorder: Secondary | ICD-10-CM | POA: Diagnosis not present

## 2018-04-25 DIAGNOSIS — E039 Hypothyroidism, unspecified: Secondary | ICD-10-CM | POA: Diagnosis not present

## 2018-04-25 DIAGNOSIS — Z719 Counseling, unspecified: Secondary | ICD-10-CM | POA: Diagnosis not present

## 2018-11-08 DIAGNOSIS — E039 Hypothyroidism, unspecified: Secondary | ICD-10-CM | POA: Diagnosis not present

## 2018-11-08 DIAGNOSIS — J4530 Mild persistent asthma, uncomplicated: Secondary | ICD-10-CM | POA: Diagnosis not present

## 2018-11-08 DIAGNOSIS — Z23 Encounter for immunization: Secondary | ICD-10-CM | POA: Diagnosis not present

## 2019-01-01 DIAGNOSIS — Z0000 Encounter for general adult medical examination without abnormal findings: Secondary | ICD-10-CM | POA: Diagnosis not present

## 2019-01-01 DIAGNOSIS — E039 Hypothyroidism, unspecified: Secondary | ICD-10-CM | POA: Diagnosis not present

## 2019-01-01 DIAGNOSIS — F3341 Major depressive disorder, recurrent, in partial remission: Secondary | ICD-10-CM | POA: Diagnosis not present

## 2019-01-01 DIAGNOSIS — Z13220 Encounter for screening for lipoid disorders: Secondary | ICD-10-CM | POA: Diagnosis not present

## 2019-01-01 DIAGNOSIS — M8588 Other specified disorders of bone density and structure, other site: Secondary | ICD-10-CM | POA: Diagnosis not present

## 2019-03-07 ENCOUNTER — Ambulatory Visit: Payer: BC Managed Care – PPO | Attending: Internal Medicine

## 2019-03-07 DIAGNOSIS — Z23 Encounter for immunization: Secondary | ICD-10-CM | POA: Insufficient documentation

## 2019-03-07 NOTE — Progress Notes (Signed)
   Covid-19 Vaccination Clinic  Name:  Tonya Rojas    MRN: [id number] DOB: 09/19/1970  03/07/2019  Tonya Rojas was observed post Covid-19 immunization for 15 minutes without incidence. She was provided with Vaccine Information Sheet and instruction to access the V-Safe system.   Tonya Rojas was instructed to call 911 with any severe reactions post vaccine: Marland Kitchen Difficulty breathing  . Swelling of your face and throat  . A fast heartbeat  . A bad rash all over your body  . Dizziness and weakness    Immunizations Administered    Name Date Dose VIS Date Route   Pfizer COVID-19 Vaccine 03/07/2019 11:16 AM 0.3 mL 12/21/2018 Intramuscular   Manufacturer: [place]   Lot: [id number]   [place]: [id number]

## 2019-04-02 ENCOUNTER — Ambulatory Visit: Payer: BC Managed Care – PPO | Attending: Internal Medicine

## 2019-04-02 DIAGNOSIS — Z23 Encounter for immunization: Secondary | ICD-10-CM

## 2019-04-02 NOTE — Progress Notes (Signed)
   Covid-19 Vaccination Clinic  Name:  Tonya Rojas    MRN: [id number] DOB: 27-Oct-1970  04/02/2019  [name] was observed post Covid-19 immunization for 15 minutes without incident. She was provided with Vaccine Information Sheet and instruction to access the V-Safe system.   [name] was instructed to call 911 with any severe reactions post vaccine: Marland Kitchen Difficulty breathing  . Swelling of face and throat  . A fast heartbeat  . A bad rash all over body  . Dizziness and weakness   Immunizations Administered    Name Date Dose VIS Date Route   Pfizer COVID-19 Vaccine 04/02/2019 10:35 AM 0.3 mL 12/21/2018 Intramuscular   Manufacturer: [place]   Lot: [id number]   [place]: [id number]

## 2019-05-07 IMAGING — CR DG CHEST 2V
2 series · 2 of 2 positions shown · non-contrast
Comparison: 03/31/2016

CLINICAL DATA: Bronchitis, congestion

EXAM:
CHEST - 2 VIEW

[w chest pa]
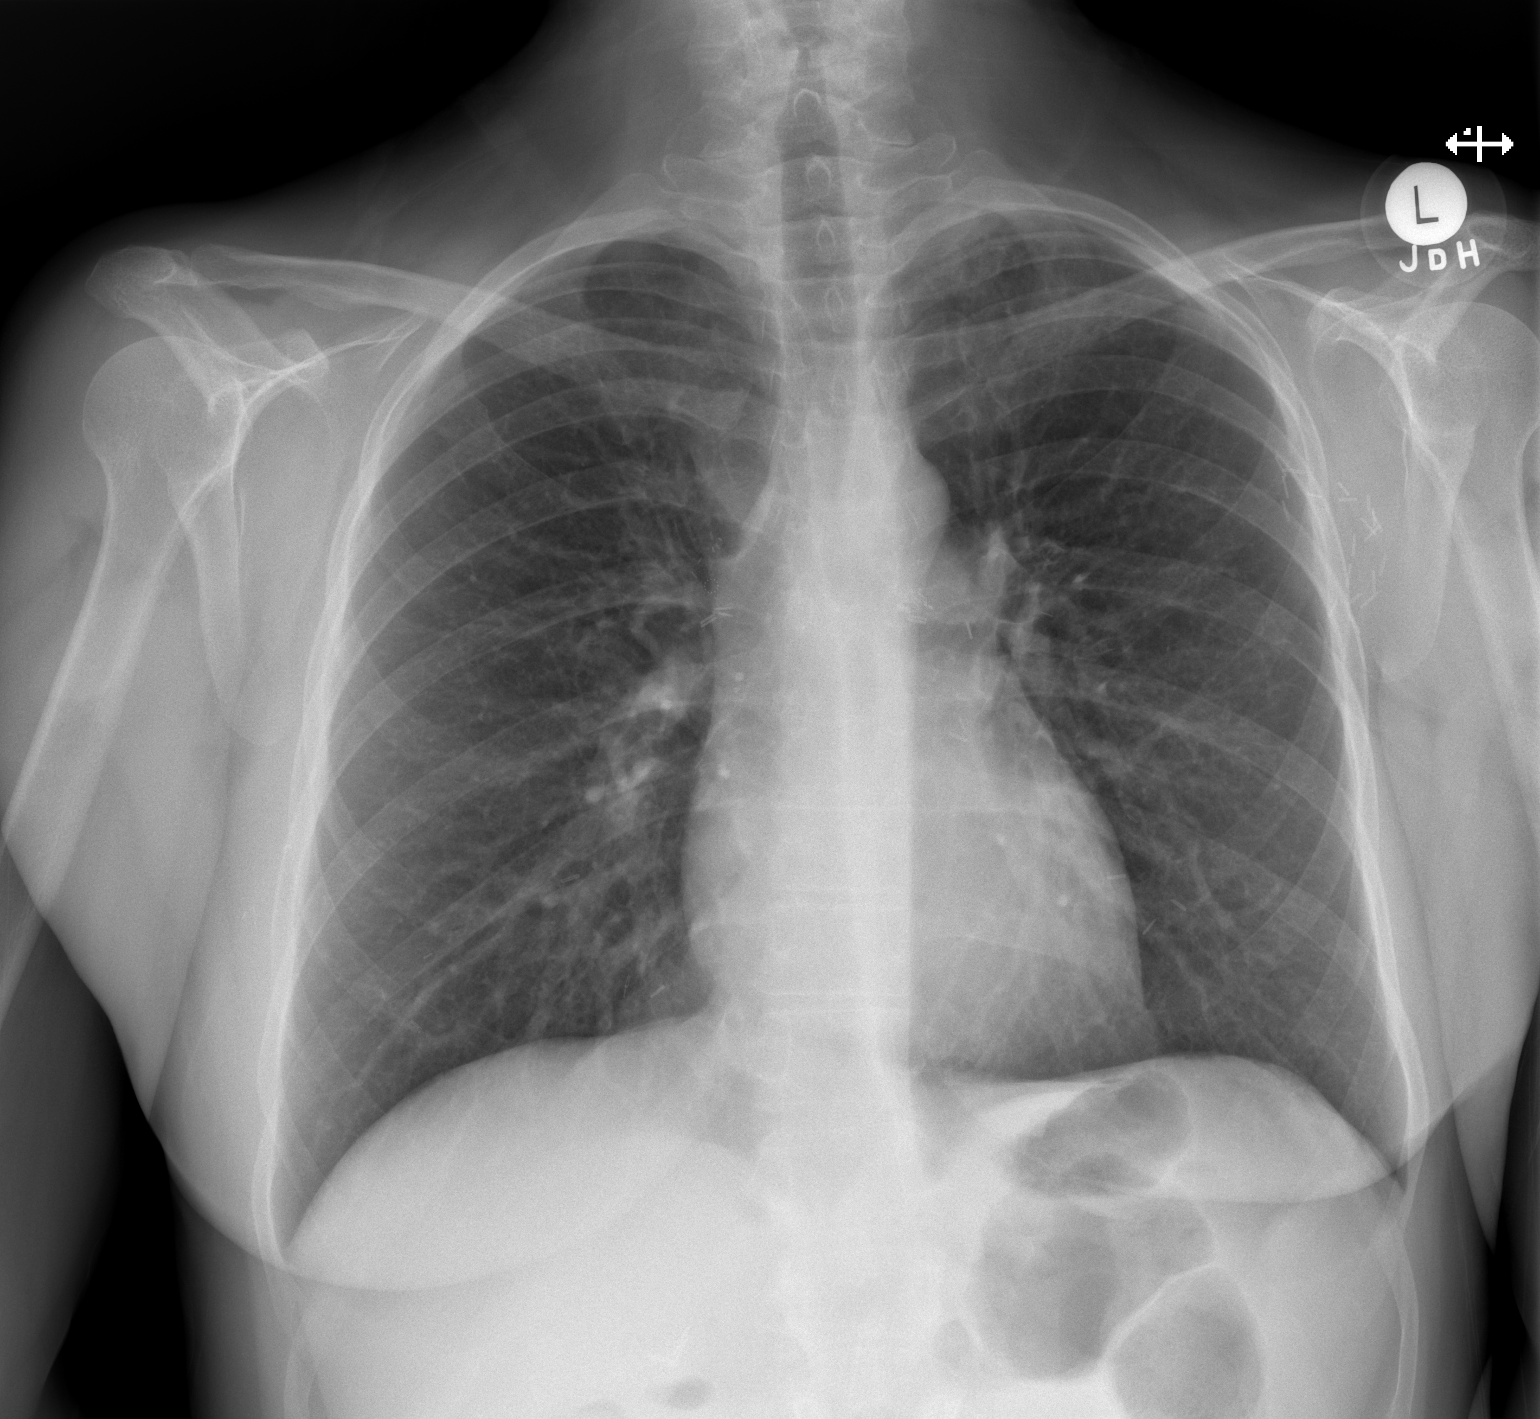

[w chest lat]
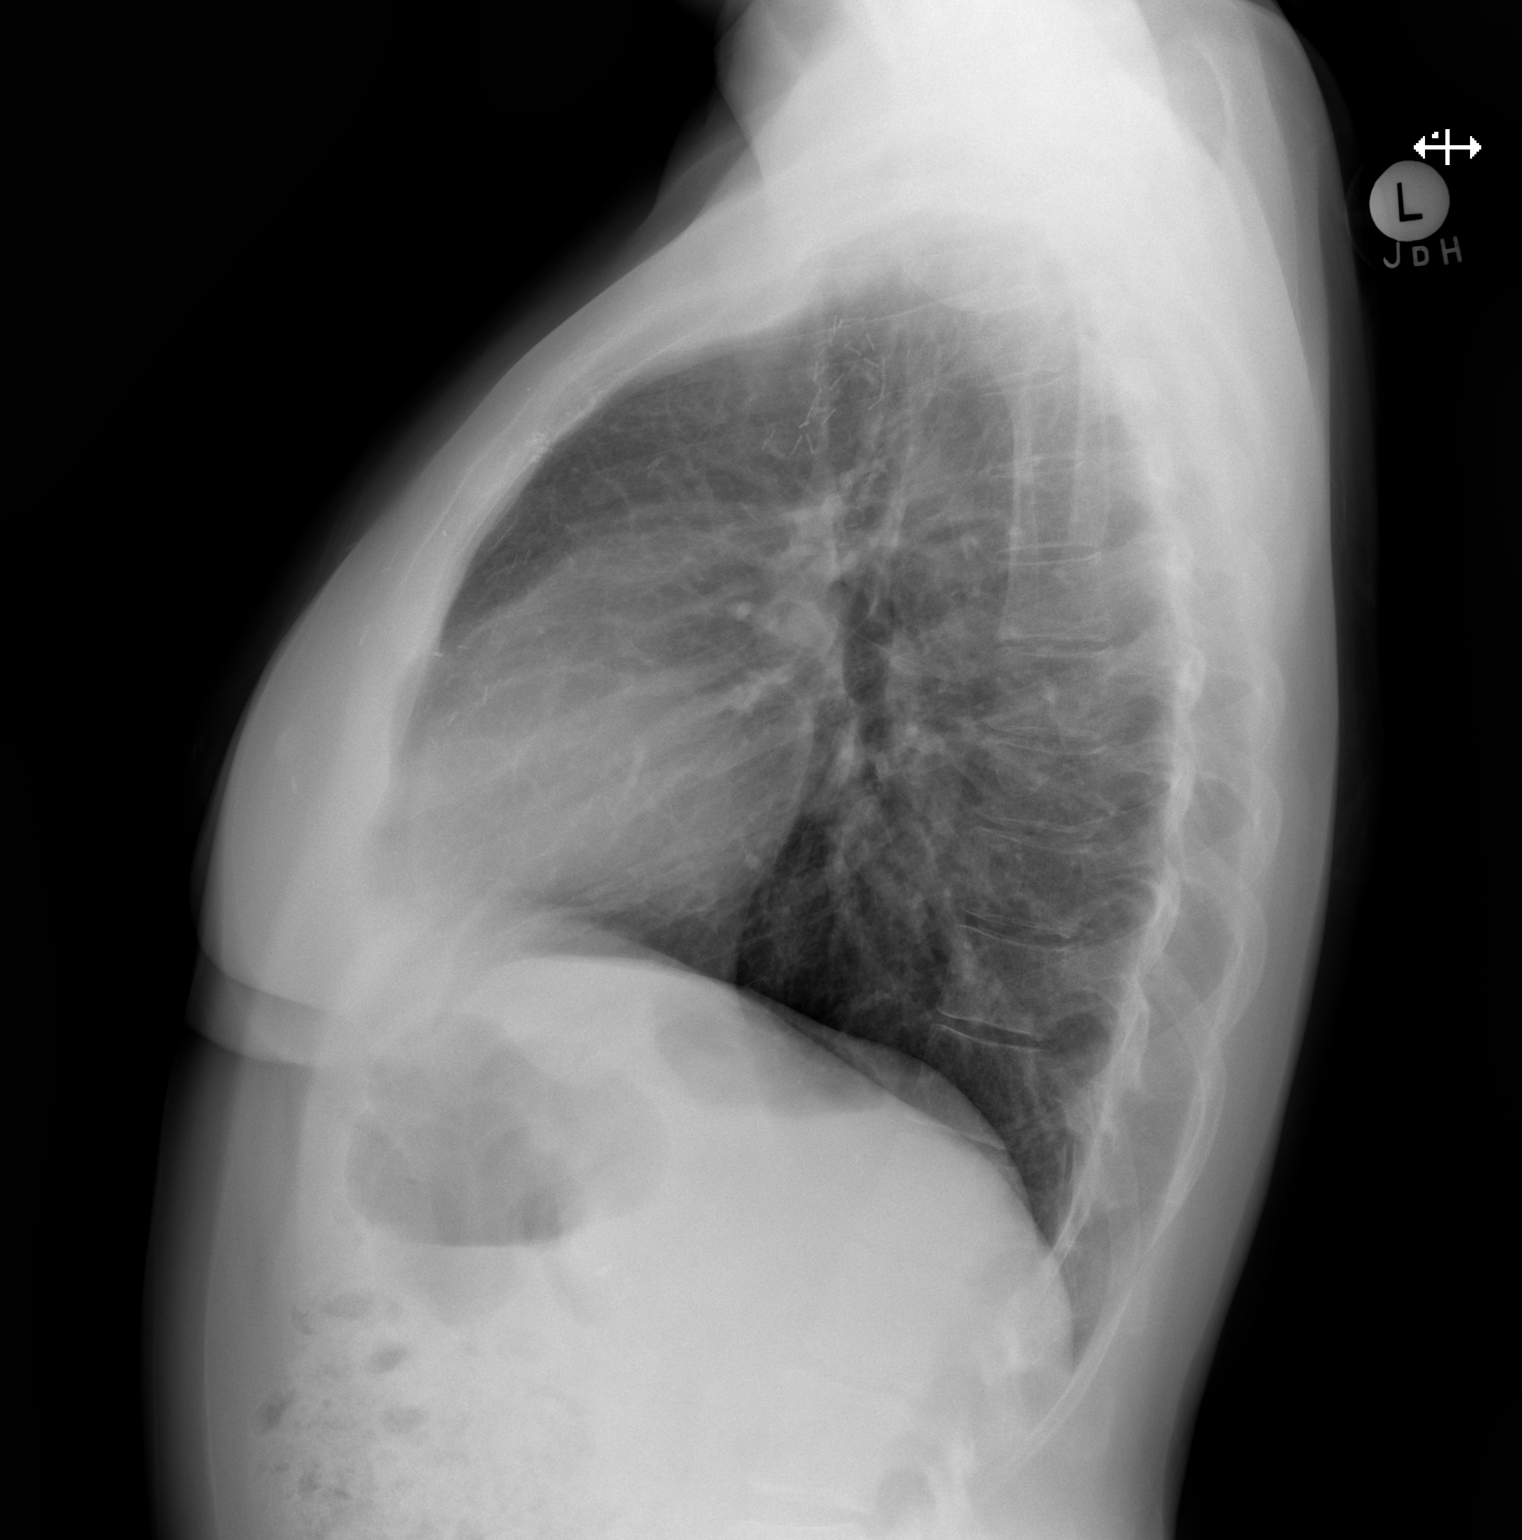

[2 of 2 positions shown; findings below may reference images not displayed]

FINDINGS: Heart and mediastinal contours are within normal limits. No focal
opacities or effusions. No acute bony abnormality.
IMPRESSION: No active cardiopulmonary disease.

## 2019-06-24 DIAGNOSIS — F411 Generalized anxiety disorder: Secondary | ICD-10-CM | POA: Diagnosis not present

## 2019-09-09 DIAGNOSIS — R05 Cough: Secondary | ICD-10-CM | POA: Diagnosis not present

## 2019-09-09 DIAGNOSIS — Z20828 Contact with and (suspected) exposure to other viral communicable diseases: Secondary | ICD-10-CM | POA: Diagnosis not present

## 2019-09-10 DIAGNOSIS — Z20828 Contact with and (suspected) exposure to other viral communicable diseases: Secondary | ICD-10-CM | POA: Diagnosis not present

## 2019-09-10 DIAGNOSIS — R05 Cough: Secondary | ICD-10-CM | POA: Diagnosis not present

## 2019-09-13 DIAGNOSIS — Z20828 Contact with and (suspected) exposure to other viral communicable diseases: Secondary | ICD-10-CM | POA: Diagnosis not present

## 2019-09-13 DIAGNOSIS — R05 Cough: Secondary | ICD-10-CM | POA: Diagnosis not present

## 2019-09-14 DIAGNOSIS — Z20828 Contact with and (suspected) exposure to other viral communicable diseases: Secondary | ICD-10-CM | POA: Diagnosis not present

## 2019-09-14 DIAGNOSIS — R05 Cough: Secondary | ICD-10-CM | POA: Diagnosis not present

## 2019-12-30 DIAGNOSIS — H0288B Meibomian gland dysfunction left eye, upper and lower eyelids: Secondary | ICD-10-CM | POA: Diagnosis not present

## 2019-12-30 DIAGNOSIS — H16223 Keratoconjunctivitis sicca, not specified as Sjogren's, bilateral: Secondary | ICD-10-CM | POA: Diagnosis not present

## 2019-12-30 DIAGNOSIS — H0288A Meibomian gland dysfunction right eye, upper and lower eyelids: Secondary | ICD-10-CM | POA: Diagnosis not present

## 2020-01-17 ENCOUNTER — Other Ambulatory Visit: Payer: Self-pay | Admitting: Family Medicine

## 2020-01-17 DIAGNOSIS — M8588 Other specified disorders of bone density and structure, other site: Secondary | ICD-10-CM

## 2020-01-24 ENCOUNTER — Other Ambulatory Visit: Payer: BC Managed Care – PPO

## 2020-01-28 ENCOUNTER — Other Ambulatory Visit: Payer: Self-pay

## 2020-01-28 ENCOUNTER — Ambulatory Visit
Admission: RE | Admit: 2020-01-28 | Discharge: 2020-01-28 | Disposition: A | Discharge status: Home / Self Care | Payer: BC Managed Care – PPO | Source: Ambulatory Visit | Attending: Family Medicine | Admitting: Family Medicine

## 2020-01-28 DIAGNOSIS — M8588 Other specified disorders of bone density and structure, other site: Secondary | ICD-10-CM
